# Patient Record
Sex: Female | Born: 1975
Health system: Southern US, Community
[De-identification: ages and names within clinical notes are randomized; demographics above are authoritative.]

## PROBLEM LIST (undated history)

## (undated) DIAGNOSIS — J45909 Unspecified asthma, uncomplicated: Secondary | ICD-10-CM

---

## 2008-03-02 HISTORY — PX: CYST EXCISION: SHX5701

## 2014-02-17 ENCOUNTER — Encounter (HOSPITAL_BASED_OUTPATIENT_CLINIC_OR_DEPARTMENT_OTHER): Payer: Self-pay | Admitting: *Deleted

## 2014-02-17 ENCOUNTER — Emergency Department (HOSPITAL_BASED_OUTPATIENT_CLINIC_OR_DEPARTMENT_OTHER)
Admission: EM | Admit: 2014-02-17 | Discharge: 2014-02-17 | Disposition: A | Discharge status: Home / Self Care | Payer: Managed Care, Other (non HMO) | Attending: Emergency Medicine | Admitting: Emergency Medicine

## 2014-02-17 DIAGNOSIS — J040 Acute laryngitis: Secondary | ICD-10-CM | POA: Diagnosis not present

## 2014-02-17 DIAGNOSIS — R0981 Nasal congestion: Secondary | ICD-10-CM | POA: Diagnosis present

## 2014-02-17 DIAGNOSIS — J069 Acute upper respiratory infection, unspecified: Secondary | ICD-10-CM | POA: Diagnosis not present

## 2014-02-17 MED ORDER — GUAIFENESIN-CODEINE 100-10 MG/5ML PO SYRP: 5.0000 mL | ORAL_SOLUTION | Freq: Three times a day (TID) | ORAL | Status: DC | PRN

## 2014-02-17 MED ORDER — PREDNISONE 10 MG PO TABS
60.0000 mg | ORAL_TABLET | Freq: Once | ORAL | Status: AC
  Administered 2014-02-17: 60 mg via ORAL
  Filled 2014-02-17 (×2): qty 1

## 2014-02-17 NOTE — ED Provider Notes (Signed)
CSN: [id number]     Arrival date & time 02/17/14  1655 History   First MD Initiated Contact with Patient 02/17/14 1711     Chief Complaint  Patient presents with  . URI     (Consider location/radiation/quality/duration/timing/severity/associated sxs/prior Treatment) HPI   38 year old female presents with URI symptoms. Patient reports for the past 3 days she has had nasal congestion, sneezing, nonproductive cough, having sore throat and bilateral ear pain. Yesterday she lost her voice. Reports subjective fever. Denies any severe headache, neck stiffness, shortness of breath, abdominal cramping, vomiting or diarrhea or rash. Patient is a nonsmoker. She has tried over-the-counter cough medication with minimal relief.  History reviewed. No pertinent past medical history. History reviewed. No pertinent past surgical history. History reviewed. No pertinent family history. History  Substance Use Topics  . Smoking status: Never Smoker   . Smokeless tobacco: Not on file  . Alcohol Use: No   OB History    No data available     Review of Systems  All other systems reviewed and are negative.     Allergies  Review of patient's allergies indicates no known allergies.  Home Medications   Prior to Admission medications   Not on File   BP 112/81 mmHg  Pulse 77  Temp(Src) 97.9 F (36.6 C) (Oral)  Resp 18  Ht 5\' 8"  (1.727 m)  Wt 168 lb (76.204 kg)  BMI 25.55 kg/m2  SpO2 100%  LMP 01/30/2014 Physical Exam  Constitutional: She appears well-developed and well-nourished. No distress.  HENT:  Head: Atraumatic.  Right Ear: External ear normal.  Left Ear: External ear normal.  Nose: Nose normal.  Mouth/Throat: Oropharynx is clear and moist. No oropharyngeal exudate.  Throat: Uvula is midline, no tonsillar enlargement or exudates, mild posterior oropharyngeal erythema without any deep tissue abscess, no trismus.  Eyes: Conjunctivae are normal.  Neck: Normal range of motion. Neck  supple.  No nuchal rigidity  Cardiovascular: Normal rate and regular rhythm.   Pulmonary/Chest: Effort normal and breath sounds normal. She has no wheezes.  Abdominal: Soft. There is no tenderness.  Lymphadenopathy:    She has no cervical adenopathy.  Neurological: She is alert.  Skin: No rash noted.  Psychiatric: She has a normal mood and affect.  Nursing note and vitals reviewed.   ED Course  Procedures (including critical care time)  Patient with URI symptoms and laryngitis. Course throat given here, patient will be treated symptomatically. antibiotic is not indicative at this time.  Labs Review Labs Reviewed - No data to display  Imaging Review No results found.   EKG Interpretation None      MDM   Final diagnoses:  Laryngitis  URI (upper respiratory infection)    BP 112/81 mmHg  Pulse 77  Temp(Src) 97.9 F (36.6 C) (Oral)  Resp 18  Ht 5\' 8"  (1.727 m)  Wt 168 lb (76.204 kg)  BMI 25.55 kg/m2  SpO2 100%  LMP 01/30/2014     Fayrene HelperBowie Maggie Senseney, PA-C 02/17/14 1825  Toy CookeyMegan Docherty, MD 02/18/14 0002

## 2014-02-17 NOTE — ED Notes (Signed)
Pt reports cough, congestion, sore throat and ear pain x 2 days.

## 2014-02-17 NOTE — Discharge Instructions (Signed)
Laryngitis At the top of your windpipe is your voice box. It is the source of your voice. Inside your voice box are 2 bands of muscles called vocal cords. When you breathe, your vocal cords are relaxed and open so that air can get into the lungs. When you decide to say something, these cords come together and vibrate. The sound from these vibrations goes into your throat and comes out through your mouth as sound. Laryngitis is an inflammation of the vocal cords that causes hoarseness, cough, loss of voice, sore throat, and dry throat. Laryngitis can be temporary (acute) or long-term (chronic). Most cases of acute laryngitis improve with time.Chronic laryngitis lasts for more than 3 weeks. CAUSES Laryngitis can often be related to excessive smoking, talking, or yelling, as well as inhalation of toxic fumes and allergies. Acute laryngitis is usually caused by a viral infection, vocal strain, measles or mumps, or bacterial infections. Chronic laryngitis is usually caused by vocal cord strain, vocal cord injury, postnasal drip, growths on the vocal cords, or acid reflux. SYMPTOMS   Cough.  Sore throat.  Dry throat. RISK FACTORS  Respiratory infections.  Exposure to irritating substances, such as cigarette smoke, excessive amounts of alcohol, stomach acids, and workplace chemicals.  Voice trauma, such as vocal cord injury from shouting or speaking too loud. DIAGNOSIS  Your cargiver will perform a physical exam. During the physical exam, your caregiver will examine your throat. The most common sign of laryngitis is hoarseness. Laryngoscopy may be necessary to confirm the diagnosis of this condition. This procedure allows your caregiver to look into the larynx. HOME CARE INSTRUCTIONS  Drink enough fluids to keep your urine clear or pale yellow.  Rest until you no longer have symptoms or as directed by your caregiver.  Breathe in moist air.  Take all medicine as directed by your  caregiver.  Do not smoke.  Talk as little as possible (this includes whispering).  Write on paper instead of talking until your voice is back to normal.  Follow up with your caregiver if your condition has not improved after 10 days. SEEK MEDICAL CARE IF:   You have trouble breathing.  You cough up blood.  You have persistent fever.  You have increasing pain.  You have difficulty swallowing. MAKE SURE YOU:  Understand these instructions.  Will watch your condition.  Will get help right away if you are not doing well or get worse. Document Released: 02/16/2005 Document Revised: 05/11/2011 Document Reviewed: 04/24/2010 [facility] Patient Information 2015 [place], [place]. This information is not intended to replace advice given to you by your health care provider. Make sure you discuss any questions you have with your health care provider.  Upper Respiratory Infection, Adult An upper respiratory infection (URI) is also known as the common cold. It is often caused by a type of germ (virus). Colds are easily spread (contagious). You can pass it to others by kissing, coughing, sneezing, or drinking out of the same glass. Usually, you get better in 1 or 2 weeks.  HOME CARE   Only take medicine as told by your doctor.  Use a warm mist humidifier or breathe in steam from a hot shower.  Drink enough water and fluids to keep your pee (urine) clear or pale yellow.  Get plenty of rest.  Return to work when your temperature is back to normal or as told by your doctor. You may use a face mask and wash your hands to stop your cold from spreading.  GET HELP RIGHT AWAY IF:   After the first few days, you feel you are getting worse.  You have questions about your medicine.  You have chills, shortness of breath, or brown or red spit (mucus).  You have yellow or brown snot (nasal discharge) or pain in the face, especially when you bend forward.  You have a fever, puffy (swollen) neck,  pain when you swallow, or white spots in the back of your throat.  You have a bad headache, ear pain, sinus pain, or chest pain.  You have a high-pitched whistling sound when you breathe in and out (wheezing).  You have a lasting cough or cough up blood.  You have sore muscles or a stiff neck. MAKE SURE YOU:   Understand these instructions.  Will watch your condition.  Will get help right away if you are not doing well or get worse. Document Released: 08/05/2007 Document Revised: 05/11/2011 Document Reviewed: 05/24/2013 [facility] Patient Information 2015 [place], [place]. This information is not intended to replace advice given to you by your health care provider. Make sure you discuss any questions you have with your health care provider.

## 2014-02-17 NOTE — ED Notes (Signed)
[facility]

## 2014-02-24 ENCOUNTER — Emergency Department (HOSPITAL_BASED_OUTPATIENT_CLINIC_OR_DEPARTMENT_OTHER)
Admission: EM | Admit: 2014-02-24 | Discharge: 2014-02-24 | Disposition: A | Discharge status: Other Acute Inpatient Hospital | Payer: Managed Care, Other (non HMO) | Attending: Emergency Medicine | Admitting: Emergency Medicine

## 2014-02-24 ENCOUNTER — Emergency Department (HOSPITAL_BASED_OUTPATIENT_CLINIC_OR_DEPARTMENT_OTHER): Payer: Managed Care, Other (non HMO)

## 2014-02-24 ENCOUNTER — Encounter (HOSPITAL_BASED_OUTPATIENT_CLINIC_OR_DEPARTMENT_OTHER): Payer: Self-pay | Admitting: *Deleted

## 2014-02-24 DIAGNOSIS — R0602 Shortness of breath: Secondary | ICD-10-CM

## 2014-02-24 DIAGNOSIS — E876 Hypokalemia: Secondary | ICD-10-CM | POA: Diagnosis not present

## 2014-02-24 DIAGNOSIS — J45902 Unspecified asthma with status asthmaticus: Secondary | ICD-10-CM | POA: Insufficient documentation

## 2014-02-24 DIAGNOSIS — Z79899 Other long term (current) drug therapy: Secondary | ICD-10-CM | POA: Insufficient documentation

## 2014-02-24 HISTORY — DX: Unspecified asthma, uncomplicated: J45.909

## 2014-02-24 LAB — TROPONIN I: Troponin I: <0.03 ng/mL (ref <0.031)

## 2014-02-24 LAB — BASIC METABOLIC PANEL
Anion gap: 10 (ref 5–15)
BUN: 8 mg/dL (ref 6–23)
CO2: 25 mmol/L (ref 19–32)
CREATININE: 0.77 mg/dL (ref 0.50–1.10)
Calcium: 8.8 mg/dL (ref 8.4–10.5)
Chloride: 105 mEq/L (ref 96–112)
Glucose, Bld: 111 mg/dL — ABNORMAL HIGH (ref 70–99)
Potassium: 3 mmol/L — ABNORMAL LOW (ref 3.5–5.1)
Sodium: 140 mmol/L (ref 135–145)

## 2014-02-24 LAB — CBC WITH DIFFERENTIAL/PLATELET
BASOS PCT: 0 % (ref 0–1)
Basophils Absolute: 0 10*3/uL (ref 0.0–0.1)
EOS ABS: 0 10*3/uL (ref 0.0–0.7)
Eosinophils Relative: 1 % (ref 0–5)
HEMATOCRIT: 38.4 % (ref 36.0–46.0)
HEMOGLOBIN: 12.9 g/dL (ref 12.0–15.0)
Lymphocytes Relative: 24 % (ref 12–46)
Lymphs Abs: 2 10*3/uL (ref 0.7–4.0)
MCH: 30.9 pg (ref 26.0–34.0)
MCHC: 33.6 g/dL (ref 30.0–36.0)
MCV: 91.9 fL (ref 78.0–100.0)
MONO ABS: 0.6 10*3/uL (ref 0.1–1.0)
Monocytes Relative: 7 % (ref 3–12)
NEUTROS PCT: 68 % (ref 43–77)
Neutro Abs: 5.9 10*3/uL (ref 1.7–7.7)
Platelets: 188 10*3/uL (ref 150–400)
RBC: 4.18 MIL/uL (ref 3.87–5.11)
RDW: 12.6 % (ref 11.5–15.5)
WBC: 8.6 10*3/uL (ref 4.0–10.5)

## 2014-02-24 LAB — BRAIN NATRIURETIC PEPTIDE: B Natriuretic Peptide: 75.4 pg/mL (ref 0.0–100.0)

## 2014-02-24 LAB — D-DIMER, QUANTITATIVE: D-Dimer, Quant: 0.27 ug/mL-FEU (ref 0.00–0.48)

## 2014-02-24 MED ORDER — IPRATROPIUM BROMIDE 0.02 % IN SOLN
0.5000 mg | Freq: Once | RESPIRATORY_TRACT | Status: AC
  Administered 2014-02-24: 0.5 mg via RESPIRATORY_TRACT
  Filled 2014-02-24: qty 2.5

## 2014-02-24 MED ORDER — PREDNISONE 10 MG PO TABS
ORAL_TABLET | ORAL | Status: AC
  Filled 2014-02-24: qty 1

## 2014-02-24 MED ORDER — PREDNISONE 10 MG PO TABS
60.0000 mg | ORAL_TABLET | Freq: Once | ORAL | Status: AC
  Administered 2014-02-24: 60 mg via ORAL
  Filled 2014-02-24 (×2): qty 1

## 2014-02-24 MED ORDER — ALBUTEROL SULFATE (2.5 MG/3ML) 0.083% IN NEBU
5.0000 mg | INHALATION_SOLUTION | Freq: Once | RESPIRATORY_TRACT | Status: AC
  Administered 2014-02-24: 5 mg via RESPIRATORY_TRACT
  Filled 2014-02-24: qty 6

## 2014-02-24 MED ORDER — IPRATROPIUM-ALBUTEROL 0.5-2.5 (3) MG/3ML IN SOLN
3.0000 mL | RESPIRATORY_TRACT | Status: DC
  Administered 2014-02-24: 3 mL via RESPIRATORY_TRACT
  Filled 2014-02-24: qty 3

## 2014-02-24 MED ORDER — ALBUTEROL (5 MG/ML) CONTINUOUS INHALATION SOLN
10.0000 mg/h | INHALATION_SOLUTION | Freq: Once | RESPIRATORY_TRACT | Status: AC
  Administered 2014-02-24: 10 mg/h via RESPIRATORY_TRACT
  Filled 2014-02-24: qty 20

## 2014-02-24 MED ORDER — POTASSIUM CHLORIDE CRYS ER 20 MEQ PO TBCR
40.0000 meq | EXTENDED_RELEASE_TABLET | Freq: Once | ORAL | Status: AC
  Administered 2014-02-24: 40 meq via ORAL
  Filled 2014-02-24: qty 2

## 2014-02-24 NOTE — ED Provider Notes (Signed)
The patient appears reasonably stabilized for transfer considering the current resources, flow, and capabilities available in the ED at this time, and I doubt any other [facility] further screening and/or treatment in the ED prior to transfer.   Joya Gaskinsonald W Lennix Rotundo, MD 02/24/14 [phone]

## 2014-02-24 NOTE — ED Provider Notes (Signed)
CSN: [id number]     Arrival date & time 02/24/14  0919 History   First MD Initiated Contact with Patient 02/24/14 [phone]     Chief Complaint  Patient presents with  . Shortness of Breath    Patient is a 38 y.o. female presenting with shortness of breath. The history is provided by the patient.  Shortness of Breath Severity:  Moderate Onset quality:  Gradual Timing:  Intermittent Progression:  Worsening Chronicity:  New Relieved by:  Rest Worsened by:  Activity Associated symptoms: cough, sore throat and wheezing   Associated symptoms: no fever and no vomiting   Pt reports recent viral illness - she reports recent cough/sore throat and sneezing This occurred last weekend Over the past 3 days she has had increased cough and SOB She feels like she is wheezing (h/o asthma) She is a nonsmoker No fever No hemoptysis She reports chest tenderness with cough only   No h/o CAD/PE  Past Medical History  Diagnosis Date  . Asthma    Past Surgical History  Procedure Laterality Date  . Cyst excision  2010    on cervix   No family history on file. History  Substance Use Topics  . Smoking status: Never Smoker   . Smokeless tobacco: Not on file  . Alcohol Use: Yes     Comment: occasionally (several a month)   OB History    No data available     Review of Systems  Constitutional: Negative for fever.  HENT: Positive for sore throat.   Respiratory: Positive for cough, shortness of breath and wheezing.   Cardiovascular: Negative for leg swelling.  Gastrointestinal: Negative for vomiting.  All other systems reviewed and are negative.     Allergies  Review of patient's allergies indicates no known allergies.  Home Medications   Prior to Admission medications   Medication Sig Start Date End Date Taking? Authorizing Provider  albuterol (PROVENTIL HFA;VENTOLIN HFA) 108 (90 BASE) MCG/ACT inhaler Inhale 2 puffs into the lungs every 6 (six) hours as needed for wheezing or  shortness of breath.   Yes Historical Provider, MD  guaiFENesin-codeine (ROBITUSSIN AC) 100-10 MG/5ML syrup Take 5 mLs by mouth 3 (three) times daily as needed for cough. 02/17/14   Fayrene HelperBowie Tran, PA-C   BP 123/73 mmHg  Pulse 82  Temp(Src) 98.6 F (37 C) (Oral)  Resp 18  Ht 5\' 8"  (1.727 m)  Wt 168 lb (76.204 kg)  BMI 25.55 kg/m2  SpO2 98%  LMP 02/22/2014 Physical Exam CONSTITUTIONAL: Well developed/well nourished HEAD: Normocephalic/atraumatic EYES: EOMI/PERRL ENMT: Mucous membranes moist, uvula midline, no erythema noted, no stridor noted NECK: supple no meningeal signs SPINE/BACK:entire spine nontender CV: S1/S2 noted, no murmurs/rubs/gallops noted LUNGS: decreased BS noted bilaterally, no apparent distress ABDOMEN: soft, nontender, no rebound or guarding, bowel sounds noted throughout abdomen GU:no cva tenderness NEURO: Pt is awake/alert/appropriate, moves all extremitiesx4.  No facial droop.   EXTREMITIES: pulses normal/equal, full ROM, no LE edema noted SKIN: warm, color normal PSYCH: no abnormalities of mood noted, alert and oriented to situation  ED Course  Procedures  CRITICAL CARE Performed by: Joya GaskinsWICKLINE,Yakima Kreitzer W Total critical care time: 33 Critical care time was exclusive of separately billable procedures and treating other patients. Critical care was necessary to treat or prevent imminent or life-threatening deterioration. Critical care was time spent personally by me on the following activities: development of treatment plan with patient and/or surrogate as well as nursing, discussions with consultants, evaluation of patient's response to treatment, examination  of patient, obtaining history from patient or surrogate, ordering and performing treatments and interventions, ordering and review of laboratory studies, ordering and review of radiographic studies, pulse oximetry and re-evaluation of patient's condition. PATIENT ADMTTED DUE TO PERSISTENT SOB AND HYPOXIA AND  AMBULATION AFTER MULTIPLE NEBULIZED TREATMENTS  10:39 AM Pt well appearing/no distress, but has decreased BS bilaterally Minimal response to initial treatment Will give more nebs/steroids 12:19 PM Pt reports continued SOB even with nebs and now reporting chest tightness Will explore for other causes of her symptoms (initial complaints were cough/sore throat/wheezing) 2:44 PM Labs unremarkable Despite treatment, pt still with tacycardia/tachypnea and hypoxia with ambulation. Will admit for possible status asthmaticus (pt still with decreased BS bilaterally) [facility]  Labs Review Labs Reviewed  BASIC METABOLIC PANEL - Abnormal; Notable for the following:    Potassium 3.0 (*)    Glucose, Bld 111 (*)    All other components within normal limits  CBC WITH DIFFERENTIAL  BRAIN NATRIURETIC PEPTIDE  TROPONIN I  D-DIMER, QUANTITATIVE    Imaging Review Dg Chest 2 View  02/24/2014   CLINICAL DATA:  Sore throat and recent upper respiratory infection/laryngitis with recent worsening of symptoms. Wheezing.  EXAM: CHEST  2 VIEW  COMPARISON:  09/27/2012  FINDINGS: Lungs are adequately inflated without consolidation or effusion. Cardiomediastinal silhouette, bones and soft tissues are normal.  IMPRESSION: No active cardiopulmonary disease.   Electronically Signed   By: Elberta Fortisaniel  Boyle M.D.   On: 02/24/2014 09:59    Medications  ipratropium-albuterol (DUONEB) 0.5-2.5 (3) MG/3ML nebulizer solution 3 mL (3 mLs Nebulization Given 02/24/14 1418)  albuterol (PROVENTIL) (2.5 MG/3ML) 0.083% nebulizer solution 5 mg (5 mg Nebulization Given 02/24/14 0945)  ipratropium (ATROVENT) nebulizer solution 0.5 mg (0.5 mg Nebulization Given 02/24/14 0945)  albuterol (PROVENTIL,VENTOLIN) solution continuous neb (10 mg/hr Nebulization Given 02/24/14 1045)  predniSONE (DELTASONE) tablet 60 mg (60 mg Oral Given 02/24/14 1038)  predniSONE (DELTASONE) 10 MG tablet (  Duplicate 02/24/14 1058)   potassium chloride SA (K-DUR,KLOR-CON) CR tablet 40 mEq (40 mEq Oral Given 02/24/14 1402)    EKG Interpretation  Date/Time:  Saturday February 24 2014 11:39:04 EST Ventricular Rate:  105 PR Interval:  126 QRS Duration: 88 QT Interval:  334 QTC Calculation: 441 R Axis:   -34 Text Interpretation:  Sinus tachycardia Possible Left atrial enlargement Left axis deviation ST \\T \ T wave abnormality, consider inferior ischemia Abnormal ECG No previous ECGs available Confirmed by Bebe ShaggyWICKLINE  MD, Dorinda HillNALD ([id number]) on 02/24/2014 12:19:29 PM        MDM   Final diagnoses:  Shortness of breath  Status asthmaticus, unspecified asthma severity  Hypokalemia    Nursing notes including past medical history and social history reviewed and considered in documentation xrays/imaging reviewed by myself and considered during evaluation Previous records reviewed and considered     Joya Gaskinsonald W Keoki Mchargue, MD 02/24/14 1446

## 2014-02-24 NOTE — ED Notes (Signed)
Ambulated pt on room air with pulse oximetry for a total of approx. 150 feet. Pulse consistently remained between 70s-90s. Approx. every 50 ft, pt would begin to feel shob as sat would drop (lowest sat was 85%). Pt would stop ambulating, take several deep breaths and sats would rise (within 30 secs) to 99-100% with pt no longer feeling shob. Dr. Bebe ShaggyWickline notified. Danna HeftyGolden, Joevon Holliman Lee, RN

## 2014-02-24 NOTE — ED Notes (Signed)
Ambulated pt approx. 175 ft on room air with pulse oximetry -- hr was between 100s-120s; approx every 50 ft., pt experienced shob with a decrease in sats (lowest 90%); pt would stop, take deep breaths and sat would return to 100% within 30 secs and pt's shob would resolve. Dr. Bebe ShaggyWickline notified. Danna HeftyGolden, Jenalee Trevizo Lee, RN

## 2014-02-24 NOTE — ED Notes (Signed)
Pt states she was here a week ago with a sore throat and dx with laryngitis and URI. Symptoms have worsened and pt states she's been wheezing and has needed her inhaler (hasn't needed today). Danna HeftyGolden, Itzabella Sorrels Lee, RN

## 2014-02-24 NOTE — ED Notes (Signed)
Patient taken off CAT at this time. Stated she feels just a bit better. RT will continue to monitor.

## 2014-02-24 NOTE — ED Notes (Signed)
Dr. Bebe ShaggyWickline notified that pt states she doesn't feel better post breathing treatments and lung sounds are unchanged. Danna HeftyGolden, Caysen Whang Lee, RN

## 2014-11-15 ENCOUNTER — Emergency Department (HOSPITAL_BASED_OUTPATIENT_CLINIC_OR_DEPARTMENT_OTHER)
Admission: EM | Admit: 2014-11-15 | Discharge: 2014-11-15 | Disposition: A | Discharge status: Home / Self Care | Payer: Managed Care, Other (non HMO) | Attending: Emergency Medicine | Admitting: Emergency Medicine

## 2014-11-15 ENCOUNTER — Encounter (HOSPITAL_BASED_OUTPATIENT_CLINIC_OR_DEPARTMENT_OTHER): Payer: Self-pay | Admitting: *Deleted

## 2014-11-15 DIAGNOSIS — R1031 Right lower quadrant pain: Secondary | ICD-10-CM | POA: Diagnosis present

## 2014-11-15 DIAGNOSIS — J45909 Unspecified asthma, uncomplicated: Secondary | ICD-10-CM | POA: Insufficient documentation

## 2014-11-15 DIAGNOSIS — R197 Diarrhea, unspecified: Secondary | ICD-10-CM

## 2014-11-15 DIAGNOSIS — R112 Nausea with vomiting, unspecified: Secondary | ICD-10-CM | POA: Insufficient documentation

## 2014-11-15 DIAGNOSIS — Z79899 Other long term (current) drug therapy: Secondary | ICD-10-CM | POA: Diagnosis not present

## 2014-11-15 DIAGNOSIS — Z3202 Encounter for pregnancy test, result negative: Secondary | ICD-10-CM | POA: Diagnosis not present

## 2014-11-15 DIAGNOSIS — R509 Fever, unspecified: Secondary | ICD-10-CM | POA: Diagnosis not present

## 2014-11-15 LAB — CBC WITH DIFFERENTIAL/PLATELET
Basophils Absolute: 0.1 10*3/uL (ref 0.0–0.1)
Basophils Relative: 1 %
Eosinophils Absolute: 0 10*3/uL (ref 0.0–0.7)
Eosinophils Relative: 0 %
HCT: 40.8 % (ref 36.0–46.0)
HEMOGLOBIN: 13.8 g/dL (ref 12.0–15.0)
LYMPHS ABS: 1 10*3/uL (ref 0.7–4.0)
LYMPHS PCT: 15 %
MCH: 30.5 pg (ref 26.0–34.0)
MCHC: 33.8 g/dL (ref 30.0–36.0)
MCV: 90.1 fL (ref 78.0–100.0)
Monocytes Absolute: 0.6 10*3/uL (ref 0.1–1.0)
Monocytes Relative: 9 %
NEUTROS PCT: 75 %
Neutro Abs: 5 10*3/uL (ref 1.7–7.7)
Platelets: 183 10*3/uL (ref 150–400)
RBC: 4.53 MIL/uL (ref 3.87–5.11)
RDW: 12.4 % (ref 11.5–15.5)
WBC: 6.6 10*3/uL (ref 4.0–10.5)

## 2014-11-15 LAB — URINALYSIS, ROUTINE W REFLEX MICROSCOPIC
GLUCOSE, UA: NEGATIVE mg/dL
Hgb urine dipstick: NEGATIVE
Ketones, ur: 15 mg/dL — AB
LEUKOCYTES UA: NEGATIVE
NITRITE: NEGATIVE
Protein, ur: NEGATIVE mg/dL
Specific Gravity, Urine: 1.025 (ref 1.005–1.030)
Urobilinogen, UA: 1 mg/dL (ref 0.0–1.0)
pH: 6 (ref 5.0–8.0)

## 2014-11-15 LAB — BASIC METABOLIC PANEL
Anion gap: 7 (ref 5–15)
BUN: 9 mg/dL (ref 6–20)
CHLORIDE: 104 mmol/L (ref 101–111)
CO2: 24 mmol/L (ref 22–32)
Calcium: 8.8 mg/dL — ABNORMAL LOW (ref 8.9–10.3)
Creatinine, Ser: 0.82 mg/dL (ref 0.44–1.00)
GFR calc Af Amer: >60 mL/min (ref >60)
GFR calc non Af Amer: >60 mL/min (ref >60)
GLUCOSE: 94 mg/dL (ref 65–99)
Potassium: 3.2 mmol/L — ABNORMAL LOW (ref 3.5–5.1)
SODIUM: 135 mmol/L (ref 135–145)

## 2014-11-15 LAB — PREGNANCY, URINE: Preg Test, Ur: NEGATIVE

## 2014-11-15 MED ORDER — ONDANSETRON 4 MG PO TBDP: ORAL_TABLET | ORAL | Status: AC

## 2014-11-15 MED ORDER — ONDANSETRON HCL 4 MG/2ML IJ SOLN
4.0000 mg | Freq: Once | INTRAMUSCULAR | Status: AC
  Administered 2014-11-15: 4 mg via INTRAVENOUS
  Filled 2014-11-15: qty 2

## 2014-11-15 MED ORDER — SODIUM CHLORIDE 0.9 % IV BOLUS (SEPSIS)
1000.0000 mL | Freq: Once | INTRAVENOUS | Status: AC
  Administered 2014-11-15: 1000 mL via INTRAVENOUS

## 2014-11-15 NOTE — ED Provider Notes (Signed)
CSN: [id number]     Arrival date & time 11/15/14  4098 History   First MD Initiated Contact with Patient 11/15/14 [phone]     Chief Complaint  Patient presents with  . Abdominal Pain     (Consider location/radiation/quality/duration/timing/severity/associated sxs/prior Treatment) Patient is a 39 y.o. female presenting with abdominal pain.  Abdominal Pain Associated symptoms: chills, diarrhea, fever, nausea and vomiting    Patient with approximately 4-5 days of diarrhea associated with some abdominal pain. Worsening right lower quadrant. Also had 2 days of nausea and vomiting however not today. She has had fevers and chills but no measured temperatures. Has multiple sick contacts with what sounds like viral gastroenteritis. She's had these symptoms before were never for this period of time. Diarrhea is just watery no blood or other abnormalities. No recent travels.  Past Medical History  Diagnosis Date  . Asthma    Past Surgical History  Procedure Laterality Date  . Cyst excision  2010    on cervix   History reviewed. No pertinent family history. Social History  Substance Use Topics  . Smoking status: Never Smoker   . Smokeless tobacco: None  . Alcohol Use: Yes     Comment: occasionally (several a month)   OB History    No data available     Review of Systems  Constitutional: Positive for fever and chills.  Gastrointestinal: Positive for nausea, vomiting, abdominal pain and diarrhea.  All other systems reviewed and are negative.     Allergies  Review of patient's allergies indicates no known allergies.  Home Medications   Prior to Admission medications   Medication Sig Start Date End Date Taking? Authorizing Provider  albuterol (PROVENTIL HFA;VENTOLIN HFA) 108 (90 BASE) MCG/ACT inhaler Inhale 2 puffs into the lungs every 6 (six) hours as needed for wheezing or shortness of breath.    Historical Provider, MD  ondansetron (ZOFRAN ODT) 4 MG disintegrating tablet  ODT  q4 hours prn nausea/vomit 11/15/14   Marily Memos, MD   BP 113/72 mmHg  Pulse 69  Temp(Src) 98.4 F (36.9 C) (Oral)  Resp 14  Ht 5' 7.5" (1.715 m)  Wt 166 lb (75.297 kg)  BMI 25.60 kg/m2  SpO2 100%  LMP 10/27/2014 Physical Exam  Constitutional: She is oriented to person, place, and time. She appears well-developed and well-nourished.  HENT:  Head: Normocephalic and atraumatic.  Eyes: Conjunctivae and EOM are normal. Right eye exhibits no discharge. Left eye exhibits no discharge.  Cardiovascular: Normal rate and regular rhythm.   Pulmonary/Chest: Effort normal and breath sounds normal. No respiratory distress.  Abdominal: Soft. She exhibits no distension. There is no tenderness. There is no rebound.  Musculoskeletal: Normal range of motion. She exhibits no edema or tenderness.  Neurological: She is alert and oriented to person, place, and time.  Skin: Skin is warm and dry.  Nursing note and vitals reviewed.   ED Course  Procedures (including critical care time) Labs Review Labs Reviewed  BASIC METABOLIC PANEL - Abnormal; Notable for the following:    Potassium 3.2 (*)    Calcium 8.8 (*)    All other components within normal limits  URINALYSIS, ROUTINE W REFLEX MICROSCOPIC (NOT AT [facility]) - Abnormal; Notable for the following:    Color, Urine AMBER (*)    APPearance CLOUDY (*)    Bilirubin Urine SMALL (*)    Ketones, ur 15 (*)    All other components within normal limits  CBC WITH DIFFERENTIAL/PLATELET  PREGNANCY, URINE  Imaging Review No results found. I have personally reviewed and evaluated these images and lab results as part of my medical decision-making.   EKG Interpretation None      MDM   Final diagnoses:  Diarrhea   Likely viral gastroenteritis. We'll evaluate with labs to make sure she does not slightly dehydrated or have a urinary tract infection. We'll treat symptomatically for now. Consider appendicitis in the differential diagnoses however with  minimal right lower quadrant tenderness, no fever, no white count, I feel this is low probability at this time and discussed with the patient that she can come here for reevaluation of her symptoms worsen. Patient is able to return if her symptoms get worse or don't improve.  I have personally and contemperaneously reviewed labs and imaging and used in my decision making as above.   A medical screening exam was performed and I feel the patient has had an appropriate workup for their chief complaint at this time and likelihood of emergent condition existing is low. They have been counseled on decision, discharge, follow up and which symptoms necessitate immediate return to the emergency department. They or their family verbally stated understanding and agreement with plan and discharged in stable condition.      Marily Memos, MD 11/15/14 1515

## 2014-11-15 NOTE — ED Notes (Signed)
Pt amb to room 1 with quick steady gait in nad. Pt reports diarrhea x Sunday, since then has had emesis x 3 total, none today, also body aches, reports subjective temp on Sunday, none since then. Pt has not taken any otc meds, states "I thought it would work itself out.Marland KitchenMarland Kitchen"

## 2016-01-16 ENCOUNTER — Emergency Department (HOSPITAL_BASED_OUTPATIENT_CLINIC_OR_DEPARTMENT_OTHER): Payer: Managed Care, Other (non HMO)

## 2016-01-16 ENCOUNTER — Emergency Department (HOSPITAL_BASED_OUTPATIENT_CLINIC_OR_DEPARTMENT_OTHER)
Admission: EM | Admit: 2016-01-16 | Discharge: 2016-01-16 | Disposition: A | Discharge status: Home / Self Care | Payer: Managed Care, Other (non HMO) | Attending: Emergency Medicine | Admitting: Emergency Medicine

## 2016-01-16 ENCOUNTER — Encounter (HOSPITAL_BASED_OUTPATIENT_CLINIC_OR_DEPARTMENT_OTHER): Payer: Self-pay | Admitting: *Deleted

## 2016-01-16 DIAGNOSIS — J45909 Unspecified asthma, uncomplicated: Secondary | ICD-10-CM | POA: Insufficient documentation

## 2016-01-16 DIAGNOSIS — R0602 Shortness of breath: Secondary | ICD-10-CM | POA: Insufficient documentation

## 2016-01-16 LAB — PREGNANCY, URINE: Preg Test, Ur: NEGATIVE

## 2016-01-16 MED ORDER — DEXAMETHASONE 6 MG PO TABS
ORAL_TABLET | ORAL | Status: AC
  Filled 2016-01-16: qty 1

## 2016-01-16 MED ORDER — IPRATROPIUM-ALBUTEROL 0.5-2.5 (3) MG/3ML IN SOLN
3.0000 mL | Freq: Four times a day (QID) | RESPIRATORY_TRACT | Status: DC
  Administered 2016-01-16: 3 mL via RESPIRATORY_TRACT
  Filled 2016-01-16: qty 3

## 2016-01-16 MED ORDER — DEXAMETHASONE 4 MG PO TABS
16.0000 mg | ORAL_TABLET | Freq: Once | ORAL | Status: AC
  Administered 2016-01-16: 16 mg via ORAL
  Filled 2016-01-16: qty 1

## 2016-01-16 NOTE — ED Provider Notes (Signed)
[facility] Provider Note   CSN: [id number] Arrival date & time: 01/16/16  1219     History   Chief Complaint Chief Complaint  Patient presents with  . Shortness of Breath    HPI Janice Harris is a 40 y.o. female.  HPI 40 year old female who presents with shortness of breath. She has a history of asthma exacerbation. States that overall symptoms very well controlled, last time ED steroids when she was admitted to [facility] for asthma exacerbation 1-2 years ago. He states that since then she has been very vigilant about her breathing, and yesterday while at work had some shortness of breath for which she took her inhaler for. When she got home she took a nebulizer treatment, and went to bed. This morning still felt a little short of breath so came to the ED for evaluation. States that this is definitely not as severe as what she normally feels with her asthma exacerbations, but states that she wanted to come sooner indicates it got worse throughout the week. No chest pain, cough, fever, URI symptoms, lower extremity edema or pain.   Past Medical History:  Diagnosis Date  . Asthma     There are no active problems to display for this patient.   Past Surgical History:  Procedure Laterality Date  . CYST EXCISION  2010   on cervix    OB History    No data available       Home Medications    Prior to Admission medications   Medication Sig Start Date End Date Taking? Authorizing Provider  albuterol (PROVENTIL HFA;VENTOLIN HFA) 108 (90 BASE) MCG/ACT inhaler Inhale 2 puffs into the lungs every 6 (six) hours as needed for wheezing or shortness of breath.    Historical Provider, MD  ondansetron (ZOFRAN ODT) 4 MG disintegrating tablet 4mg  ODT q4 hours prn nausea/vomit 11/15/14   Marily MemosJason Mesner, MD    Family History No family history on file.  Social History Social History  Substance Use Topics  . Smoking status: Never Smoker  . Smokeless tobacco: Never  Used  . Alcohol use Yes     Comment: occasionally (several a month)     Allergies   Patient has no known allergies.   Review of Systems Review of Systems 10/14 systems reviewed and are negative other than those stated in the HPI   Physical Exam Updated Vital Signs BP 118/79   Pulse 80   Temp 98.1 F (36.7 C) (Oral)   Resp 18   Ht 5\' 7"  (1.702 m)   Wt 173 lb (78.5 kg)   LMP 12/31/2015   SpO2 100%   BMI 27.10 kg/m   Physical Exam Physical Exam  Nursing note and vitals reviewed. Constitutional: Well developed, well nourished, non-toxic, and in no acute distress Head: Normocephalic and atraumatic.  Mouth/Throat: Oropharynx is clear and moist.  Neck: Normal range of motion. Neck supple.  Cardiovascular: Normal rate and regular rhythm.   Pulmonary/Chest: Effort normal and breath sounds normal.  Abdominal: Soft. There is no tenderness. There is no rebound and no guarding.  Musculoskeletal: Normal range of motion.  Neurological: Alert, no facial droop, fluent speech, moves all extremities symmetrically Skin: Skin is warm and dry.  Psychiatric: Cooperative   ED Treatments / Results  Labs (all labs ordered are listed, but only abnormal results are displayed) Labs Reviewed  PREGNANCY, URINE    EKG  EKG Interpretation  Date/Time:  Thursday January 16 2016 13:25:58 EST Ventricular Rate:  61 PR Interval:    QRS Duration: 88 QT Interval:  385 QTC Calculation: 388 R Axis:   56 Text Interpretation:  Sinus rhythm Confirmed by Askari Kinley MD, Mitzie Marlar [phone]) on 01/16/2016 2:03:58 PM       Radiology Dg Chest 2 View  Result Date: 01/16/2016 CLINICAL DATA:  Several days of shortness of breath associated with chest tightness. Nonsmoker. EXAM: CHEST  2 VIEW COMPARISON:  PA and lateral chest x-ray of February 24, 2014 FINDINGS: The lungs are well-expanded. There is no focal infiltrate. There is no pleural effusion. The heart and pulmonary vascularity are normal. The mediastinum is  normal in width. The bony thorax is unremarkable. IMPRESSION: There is no active cardiopulmonary disease. Electronically Signed   By: David  SwazilandJordan M.D.   On: 01/16/2016 14:00    Procedures Procedures (including critical care time)  Medications Ordered in ED Medications  ipratropium-albuterol (DUONEB) 0.5-2.5 (3) MG/3ML nebulizer solution 3 mL (3 mLs Nebulization Given 01/16/16 1334)  dexamethasone (DECADRON) 6 MG tablet (  Not Given 01/16/16 1333)  dexamethasone (DECADRON) tablet 16 mg (16 mg Oral Given 01/16/16 1330)     Initial Impression / Assessment and Plan / ED Course  I have reviewed the triage vital signs and the nursing notes.  Pertinent labs & imaging results that were available during my care of the patient were reviewed by me and considered in my medical decision making (see chart for details).  Clinical Course     40 year old female who presents with shortness of breath. She is well-appearing in no acute distress with normal vital signs, normal work of breathing, and overall clear lungs. Was ambulating here in the emergency department without any increased work of breathing or complaints of shortness of breath. Chest x-ray is visualized and w/o pneumonia, edema, or other acute cardiopulmonary processes. Did receive a dose of Decadron and breathing treatment. PERC negative and no concerns for PE. She appears well, with normal work of breathing. I do not feel that she necessarily requires any additional treatment or imaging at this time. To continue supportive care instructions for home. Strict return and follow-up instructions reviewed. She expressed understanding of all discharge instructions and felt comfortable with the plan of care.   Final Clinical Impressions(s) / ED Diagnoses   Final diagnoses:  SOB (shortness of breath)    New Prescriptions New Prescriptions   No medications on file     Lavera Guiseana Duo Axcel Horsch, MD 01/16/16 1428

## 2016-01-16 NOTE — Discharge Instructions (Signed)
Your chest x-ray looks good and your breathing and lung sounds are reassuring.  Continue to use your inhaler as needed.   Return without fail for worsening symptoms, including fever, difficulty breathing, chest pain, passing out or any other symptoms concerning to you.

## 2016-01-16 NOTE — ED Triage Notes (Signed)
Hx of asthma. Yesterday at work she had a episode of SOB unrelieved by her inhaler. She used her nebulizer at home with no change. She is speaking in complete sentences. Drove herself here.

## 2016-07-17 ENCOUNTER — Emergency Department (HOSPITAL_BASED_OUTPATIENT_CLINIC_OR_DEPARTMENT_OTHER): Payer: Managed Care, Other (non HMO)

## 2016-07-17 ENCOUNTER — Encounter (HOSPITAL_BASED_OUTPATIENT_CLINIC_OR_DEPARTMENT_OTHER): Payer: Self-pay | Admitting: Emergency Medicine

## 2016-07-17 ENCOUNTER — Emergency Department (HOSPITAL_BASED_OUTPATIENT_CLINIC_OR_DEPARTMENT_OTHER)
Admission: EM | Admit: 2016-07-17 | Discharge: 2016-07-17 | Disposition: A | Discharge status: Home / Self Care | Payer: Managed Care, Other (non HMO) | Attending: Emergency Medicine | Admitting: Emergency Medicine

## 2016-07-17 DIAGNOSIS — R0981 Nasal congestion: Secondary | ICD-10-CM | POA: Diagnosis not present

## 2016-07-17 DIAGNOSIS — J029 Acute pharyngitis, unspecified: Secondary | ICD-10-CM | POA: Insufficient documentation

## 2016-07-17 DIAGNOSIS — J45909 Unspecified asthma, uncomplicated: Secondary | ICD-10-CM | POA: Diagnosis not present

## 2016-07-17 DIAGNOSIS — R05 Cough: Secondary | ICD-10-CM | POA: Diagnosis present

## 2016-07-17 DIAGNOSIS — J069 Acute upper respiratory infection, unspecified: Secondary | ICD-10-CM | POA: Diagnosis not present

## 2016-07-17 LAB — RAPID STREP SCREEN (MED CTR MEBANE ONLY): Streptococcus, Group A Screen (Direct): NEGATIVE

## 2016-07-17 MED ORDER — LORATADINE-PSEUDOEPHEDRINE ER 5-120 MG PO TB12: 1.0000 | ORAL_TABLET | Freq: Two times a day (BID) | ORAL | 0 refills | Status: AC

## 2016-07-17 MED ORDER — IPRATROPIUM-ALBUTEROL 0.5-2.5 (3) MG/3ML IN SOLN
3.0000 mL | Freq: Four times a day (QID) | RESPIRATORY_TRACT | Status: DC
  Administered 2016-07-17: 3 mL via RESPIRATORY_TRACT
  Filled 2016-07-17: qty 3

## 2016-07-17 MED FILL — ALAVERT D-12 ALLERGY-SINUS: 5-120 | 7 days supply | Qty: 14 | Fill #0

## 2016-07-17 NOTE — ED Triage Notes (Signed)
Cough, sore throat, ear fullness and headache x 3 days. No relief with OTC meds. Hx of asthma. Using inhalers without relief.

## 2016-07-17 NOTE — ED Provider Notes (Signed)
[facility] Note   CSN: [id number] Arrival date & time: 07/17/16  0803     History   Chief Complaint Chief Complaint  Patient presents with  . Cough  . Sore Throat    HPI Janice Harris is a 41 y.o. female.  Patient with four-day history of cough sore throat and ear fullness sinus pressure and sinus pain. Patient has a history of asthma. Using her inhaler without any relief. Patient is felt as if she has been wheezing. No nausea vomiting no rash.      Past Medical History:  Diagnosis Date  . Asthma     There are no active problems to display for this patient.   Past Surgical History:  Procedure Laterality Date  . CYST EXCISION  2010   on cervix    OB History    No data available       Home Medications    Prior to Admission medications   Medication Sig Start Date End Date Taking? Authorizing Provider  albuterol (PROVENTIL HFA;VENTOLIN HFA) 108 (90 BASE) MCG/ACT inhaler Inhale 2 puffs into the lungs every 6 (six) hours as needed for wheezing or shortness of breath.    [provider]  loratadine-pseudoephedrine (CLARITIN-D 12 HOUR) 5-120 MG tablet Take 1 tablet by mouth 2 (two) times daily. 07/17/16   Vanetta MuldersZackowski, Kameka Whan, MD  ondansetron (ZOFRAN ODT) 4 MG disintegrating tablet 4mg  ODT q4 hours prn nausea/vomit 11/15/14   Mesner, Barbara CowerJason, MD    Family History No family history on file.  Social History Social History  Substance Use Topics  . Smoking status: Never Smoker  . Smokeless tobacco: Never Used  . Alcohol use Yes     Comment: occasionally (several a month)     Allergies   Patient has no known allergies.   Review of Systems Review of Systems  Constitutional: Negative for fever.  HENT: Positive for congestion and sinus pressure.   Eyes: Negative for redness.  Respiratory: Positive for cough, shortness of breath and wheezing.   Cardiovascular: Negative for chest pain.  Gastrointestinal: Negative for abdominal pain,  nausea and vomiting.  Genitourinary: Negative for dysuria.  Musculoskeletal: Negative for back pain.  Skin: Negative for rash.  Neurological: Positive for headaches. Negative for syncope, weakness and numbness.  Hematological: Does not bruise/bleed easily.  Psychiatric/Behavioral: Negative for confusion.     Physical Exam Updated Vital Signs BP 111/84 (BP Location: Right Arm)   Pulse 80   Temp 98.9 F (37.2 C) (Oral)   Resp 18   Ht 5\' 7"  (1.702 m)   Wt 175 lb (79.4 kg)   LMP 07/09/2016   SpO2 100%   BMI 27.41 kg/m   Physical Exam  Constitutional: She is oriented to person, place, and time. She appears well-developed and well-nourished. No distress.  HENT:  Head: Normocephalic and atraumatic.  Right Ear: External ear normal.  Left Ear: External ear normal.  Mouth/Throat: Oropharynx is clear and moist.  Eyes: Conjunctivae and EOM are normal. Pupils are equal, round, and reactive to light.  Neck: Normal range of motion. Neck supple.  Cardiovascular: Normal rate, regular rhythm and normal heart sounds.   Pulmonary/Chest: Effort normal and breath sounds normal. No respiratory distress. She has no wheezes.  Abdominal: Soft. Bowel sounds are normal. There is no tenderness.  Musculoskeletal: Normal range of motion.  Neurological: She is alert and oriented to person, place, and time. No cranial nerve deficit or sensory deficit. She exhibits normal muscle tone. Coordination normal.  Skin: Skin is warm and dry.  Nursing note and vitals reviewed.    ED Treatments / Results  Labs (all labs ordered are listed, but only abnormal results are displayed) Labs Reviewed  RAPID STREP SCREEN (NOT AT [facility])  CULTURE, GROUP A STREP [facility])    EKG  EKG Interpretation None       Radiology Dg Chest 2 View  Result Date: 07/17/2016 CLINICAL DATA:  Fever and cough EXAM: CHEST  2 VIEW COMPARISON:  01/16/2016 FINDINGS: Normal heart size and mediastinal contours. No acute infiltrate or  edema. No effusion or pneumothorax. No acute osseous findings. IMPRESSION: Negative chest. Electronically Signed   By: Marnee Spring M.D.   On: 07/17/2016 08:56    Procedures Procedures (including critical care time)  Medications Ordered in ED Medications  ipratropium-albuterol (DUONEB) 0.5-2.5 (3) MG/3ML nebulizer solution 3 mL (3 mLs Nebulization Given 07/17/16 0847)     Initial Impression / Assessment and Plan / ED Course  I have reviewed the triage vital signs and the nursing notes.  Pertinent labs & imaging results that were available during my care of the patient were reviewed by me and considered in my medical decision making (see chart for details).     Workup negative for pneumonia negative for strep throat. Symptoms's seem to be related to sinus congestion may be upper respiratory infection may be allergies. Will try a course of Claritin-D. Patient nontoxic no acute distress.  The patient felt as if she was wheezing no audible wheezing heard. No change after breathing treatment. Patient does have albuterol inhaler available at home.  Final Clinical Impressions(s) / ED Diagnoses   Final diagnoses:  Sore throat  Viral upper respiratory tract infection  Sinus congestion    New Prescriptions New Prescriptions   LORATADINE-PSEUDOEPHEDRINE (CLARITIN-D 12 HOUR) 5-120 MG TABLET    Take 1 tablet by mouth 2 (two) times daily.     Vanetta Mulders, MD 07/17/16 [phone]

## 2016-07-17 NOTE — Discharge Instructions (Signed)
Chest x-ray negative for pneumonia. Strep test negative for strep pharyngitis. Symptoms could be allergy or upper respiratory infection related. Continue to use your albuterol inhaler. And take the Claritin-D as directed. Follow up with your doctors. Return for any new or worse symptoms. Work note provided.

## 2016-07-19 LAB — CULTURE, GROUP A STREP (THRC)

## 2016-10-04 ENCOUNTER — Emergency Department (HOSPITAL_BASED_OUTPATIENT_CLINIC_OR_DEPARTMENT_OTHER)
Admission: EM | Admit: 2016-10-04 | Discharge: 2016-10-05 | Disposition: A | Discharge status: Home / Self Care | Payer: Managed Care, Other (non HMO) | Attending: Emergency Medicine | Admitting: Emergency Medicine

## 2016-10-04 ENCOUNTER — Encounter (HOSPITAL_BASED_OUTPATIENT_CLINIC_OR_DEPARTMENT_OTHER): Payer: Self-pay

## 2016-10-04 DIAGNOSIS — M791 Myalgia: Secondary | ICD-10-CM | POA: Diagnosis not present

## 2016-10-04 DIAGNOSIS — J0100 Acute maxillary sinusitis, unspecified: Secondary | ICD-10-CM

## 2016-10-04 DIAGNOSIS — J111 Influenza due to unidentified influenza virus with other respiratory manifestations: Secondary | ICD-10-CM

## 2016-10-04 DIAGNOSIS — J45909 Unspecified asthma, uncomplicated: Secondary | ICD-10-CM | POA: Insufficient documentation

## 2016-10-04 DIAGNOSIS — R509 Fever, unspecified: Secondary | ICD-10-CM | POA: Diagnosis not present

## 2016-10-04 DIAGNOSIS — R69 Illness, unspecified: Secondary | ICD-10-CM

## 2016-10-04 DIAGNOSIS — R110 Nausea: Secondary | ICD-10-CM | POA: Diagnosis present

## 2016-10-04 MED ORDER — AMOXICILLIN 500 MG PO CAPS: 1000.0000 mg | ORAL_CAPSULE | Freq: Two times a day (BID) | ORAL | 0 refills | Status: AC

## 2016-10-04 MED ORDER — AMOXICILLIN 500 MG PO CAPS
1000.0000 mg | ORAL_CAPSULE | Freq: Once | ORAL | Status: AC
  Administered 2016-10-05: 1000 mg via ORAL
  Filled 2016-10-04: qty 2

## 2016-10-04 NOTE — ED Triage Notes (Signed)
Got back from [place] on Monday and now having flu like symptoms with headache, body aches, nausea, congestion

## 2016-10-04 NOTE — ED Provider Notes (Signed)
[facility] Note   CSN: [id number] Arrival date & time: 10/04/16  1951  By signing my name below, I, Linna DarnerRussell Turner, attest that this documentation has been prepared under the direction and in the presence of physician practitioner, Dione BoozeGlick, Aylan Bayona, MD. Electronically Signed: Linna Darnerussell Turner, Scribe. 10/04/2016. 11:58 PM.  History   Chief Complaint Chief Complaint  Patient presents with  . Generalized Body Aches   The history is provided by the patient. No language interpreter was used.    HPI Comments: Janice Harris is a 41 y.o. female who presents to the Emergency Department complaining of persistent generalized body aches beginning three days ago. She reports associated nasal congestion, rhinorrhea with yellow-tinted mucus, sinus pain, subjective fevers/chills, nausea without vomiting, and a reduced appetite. There are no alleviating factors noted. Patient returned from a trip to [place], [place] six days ago. Two friends that went to [place] with her were recently diagnosed with influenza. Patient denies cough, vomiting, diarrhea, sore throat, or any other associated symptoms. She [place] care in [place] and intends to follow up should her symptoms persist.  Past Medical History:  Diagnosis Date  . Asthma     There are no active problems to display for this patient.   Past Surgical History:  Procedure Laterality Date  . CYST EXCISION  2010   on cervix    OB History    No data available       Home Medications    Prior to Admission medications   Medication Sig Start Date End Date Taking? Authorizing Provider  albuterol (PROVENTIL HFA;VENTOLIN HFA) 108 (90 BASE) MCG/ACT inhaler Inhale 2 puffs into the lungs every 6 (six) hours as needed for wheezing or shortness of breath.    [provider]  loratadine-pseudoephedrine (CLARITIN-D 12 HOUR) 5-120 MG tablet Take 1 tablet by mouth 2 (two) times daily. 07/17/16   Vanetta MuldersZackowski, Scott, MD    ondansetron (ZOFRAN ODT) 4 MG disintegrating tablet 4mg  ODT q4 hours prn nausea/vomit 11/15/14   Mesner, Barbara CowerJason, MD    Family History No family history on file.  Social History Social History  Substance Use Topics  . Smoking status: Never Smoker  . Smokeless tobacco: Never Used  . Alcohol use Yes     Comment: occasionally (several a month)     Allergies   Patient has no known allergies.   Review of Systems Review of Systems  All other systems reviewed and are negative.  Physical Exam Updated Vital Signs BP (!) 120/98 (BP Location: Left Arm)   Pulse 88   Temp 99.5 F (37.5 C) (Oral)   Resp 18   Ht 5\' 7"  (1.702 m)   Wt 173 lb (78.5 kg)   LMP 09/22/2016   SpO2 100%   BMI 27.10 kg/m   Physical Exam  Constitutional: She is oriented to person, place, and time. She appears well-developed and well-nourished.  HENT:  Head: Normocephalic and atraumatic.  Moderate tenderness to the bilateral maxillary sinuses.  Eyes: Pupils are equal, round, and reactive to light. EOM are normal.  Neck: Normal range of motion. Neck supple. No JVD present.  Cardiovascular: Normal rate, regular rhythm and normal heart sounds.   No murmur heard. Pulmonary/Chest: Effort normal and breath sounds normal. She has no wheezes. She has no rales. She exhibits no tenderness.  Abdominal: Soft. Bowel sounds are normal. She exhibits no distension and no mass. There is no tenderness.  Musculoskeletal: Normal range of motion. She exhibits no edema.  Lymphadenopathy:    She has no cervical adenopathy.  Neurological: She is alert and oriented to person, place, and time. No cranial nerve deficit. She exhibits normal muscle tone. Coordination normal.  Skin: Skin is warm and dry. No rash noted.  Psychiatric: She has a normal mood and affect. Her behavior is normal. Judgment and thought content normal.  Nursing note and vitals reviewed.  ED Treatments / Results   Procedures Procedures (including critical  care time)  DIAGNOSTIC STUDIES: Oxygen Saturation is 100% on RA, normal by my interpretation.    COORDINATION OF CARE: 11:58 PM Discussed treatment plan with pt at bedside and pt agreed to plan.  Medications Ordered in ED Medications  amoxicillin (AMOXIL) capsule 1,000 mg (not administered)     Initial Impression / Assessment and Plan / ED Course  I have reviewed the triage vital signs and the nursing notes.  Influenza-like illness with apparent secondary sinusitis. She has been ill for too long to consider antiviral treatment for influenza. She is discharged with prescription for amoxicillin, follow-up with her PCP. Given work release for 48 hours.  Final Clinical Impressions(s) / ED Diagnoses   Final diagnoses:  Influenza-like illness  Acute non-recurrent maxillary sinusitis    New Prescriptions New Prescriptions   AMOXICILLIN (AMOXIL) 500 MG CAPSULE    Take 2 capsules (1,000 mg total) by mouth 2 (two) times daily.   I personally performed the services described in this documentation, which was scribed in my presence. The recorded information has been reviewed and is accurate.     Dione BoozeGlick, Trey Gulbranson, MD 10/05/16 [id number]

## 2016-10-04 NOTE — ED Notes (Signed)
EDP at bedside  

## 2016-10-05 NOTE — Discharge Instructions (Signed)
Take ibuprofen, naproxen, or acetaminophen as needed for fever or aching. Drink plenty of fluids.  Sinusitis sometimes needs a longer course of antibiotics. If you are not feeling better after ten days on this antibiotic, see your doctor for an additional antibiotic prescription.

## 2016-11-10 ENCOUNTER — Emergency Department (HOSPITAL_BASED_OUTPATIENT_CLINIC_OR_DEPARTMENT_OTHER)
Admission: EM | Admit: 2016-11-10 | Discharge: 2016-11-10 | Disposition: A | Discharge status: Home / Self Care | Payer: Managed Care, Other (non HMO) | Attending: Emergency Medicine | Admitting: Emergency Medicine

## 2016-11-10 ENCOUNTER — Encounter (HOSPITAL_BASED_OUTPATIENT_CLINIC_OR_DEPARTMENT_OTHER): Payer: Self-pay | Admitting: Emergency Medicine

## 2016-11-10 DIAGNOSIS — R21 Rash and other nonspecific skin eruption: Secondary | ICD-10-CM | POA: Diagnosis not present

## 2016-11-10 DIAGNOSIS — J45909 Unspecified asthma, uncomplicated: Secondary | ICD-10-CM | POA: Diagnosis not present

## 2016-11-10 DIAGNOSIS — Z79899 Other long term (current) drug therapy: Secondary | ICD-10-CM | POA: Insufficient documentation

## 2016-11-10 MED ORDER — PREDNISONE 20 MG PO TABS: 40.0000 mg | ORAL_TABLET | Freq: Every day | ORAL | 0 refills | Status: AC

## 2016-11-10 MED ORDER — TRIAMCINOLONE ACETONIDE 0.1 % EX CREA: 1.0000 "application " | TOPICAL_CREAM | Freq: Two times a day (BID) | CUTANEOUS | 0 refills | Status: AC

## 2016-11-10 MED ORDER — HYDROXYZINE HCL 25 MG PO TABS: 25.0000 mg | ORAL_TABLET | Freq: Four times a day (QID) | ORAL | 0 refills | Status: AC

## 2016-11-10 MED ORDER — PREDNISONE 20 MG PO TABS
40.0000 mg | ORAL_TABLET | Freq: Once | ORAL | Status: AC
  Administered 2016-11-10: 40 mg via ORAL
  Filled 2016-11-10: qty 2

## 2016-11-10 NOTE — ED Notes (Signed)
Pt verbalizes understanding of d/c instructions and denies any further needs at this time. 

## 2016-11-10 NOTE — Discharge Instructions (Signed)
Please read and follow all provided instructions.  Your diagnoses today include:  1. Rash and nonspecific skin eruption    Tests performed today include:  Vital signs. See below for your results today.   Medications prescribed:   Prednisone - steroid medicine   It is best to take this medication in the morning to prevent sleeping problems. If you are diabetic, monitor your blood sugar closely and stop taking Prednisone if blood sugar is over 300. Take with food to prevent stomach upset.    Hydroxyzine - antihistamine  You can find this medication over-the-counter.   This medication will make you drowsy. DO NOT drive or perform any activities that require you to be awake and alert if taking this.   Triamcinolone (Kenalog) cream - topical steroid medication for skin reaction  Take any prescribed medications only as directed.  Home care instructions:  Follow any educational materials contained in this packet.  BE VERY CAREFUL not to take multiple medicines containing Tylenol (also called acetaminophen). Doing so can lead to an overdose which can damage your liver and cause liver failure and possibly death.   Follow-up instructions: Please follow-up with your primary care provider in the next 3 days for further evaluation of your symptoms if not improved.   Return instructions:   Please return to the Emergency Department if you experience worsening symptoms.   Please return if you have any other emergent concerns.  Additional Information:  Your vital signs today were: BP 120/80 (BP Location: Left Arm)    Pulse 85    Temp 98.7 F (37.1 C) (Oral)    Resp 18    Ht 5\' 8"  (1.727 m)    Wt 77.1 kg (170 lb)    LMP 11/10/2016    SpO2 100%    BMI 25.85 kg/m  If your blood pressure (BP) was elevated above 135/85 this visit, please have this repeated by your doctor within one month. --------------

## 2016-11-10 NOTE — ED Provider Notes (Signed)
[facility] Provider Note   CSN: [id number] Arrival date & time: 11/10/16  1828     History   Chief Complaint Chief Complaint  Patient presents with  . Rash    HPI Janice Harris is a 41 y.o. female.  Patient presents with complaint of rash ongoing over the past 3 days. Patient was outdoors prior to onset and noted several mosquitoes. She developed itchy papules on exposed areas of the body including her shoulders, arms, and legs. She has tried Benadryl without relief. No other family members at home have similar symptoms. She has not had any fever or viral symptoms. No recent tick bites. No new skin soaps or detergents. No suspicious foods. No tongue swelling or difficulty breathing. Patient was concerned about bedbugs. She is not staying anywhere new. Onset of symptoms acute. Course is constant.      Past Medical History:  Diagnosis Date  . Asthma     There are no active problems to display for this patient.   Past Surgical History:  Procedure Laterality Date  . CYST EXCISION  2010   on cervix    OB History    No data available       Home Medications    Prior to Admission medications   Medication Sig Start Date End Date Taking? Authorizing Provider  albuterol (PROVENTIL HFA;VENTOLIN HFA) 108 (90 BASE) MCG/ACT inhaler Inhale 2 puffs into the lungs every 6 (six) hours as needed for wheezing or shortness of breath.    [provider]  amoxicillin (AMOXIL) 500 MG capsule Take 2 capsules (1,000 mg total) by mouth 2 (two) times daily. 10/04/16   Dione BoozeGlick, David, MD  hydrOXYzine (ATARAX/VISTARIL) 25 MG tablet Take 1 tablet (25 mg total) by mouth every 6 (six) hours. 11/10/16   Renne CriglerGeiple, Shaquan Missey, PA-C  loratadine-pseudoephedrine (CLARITIN-D 12 HOUR) 5-120 MG tablet Take 1 tablet by mouth 2 (two) times daily. 07/17/16   Vanetta MuldersZackowski, Scott, MD  ondansetron (ZOFRAN ODT) 4 MG disintegrating tablet 4mg  ODT q4 hours prn nausea/vomit 11/15/14   Mesner, Barbara CowerJason, MD    predniSONE (DELTASONE) 20 MG tablet Take 2 tablets (40 mg total) by mouth daily. 11/10/16   Renne CriglerGeiple, Sekai Gitlin, PA-C  triamcinolone cream (KENALOG) 0.1 % Apply 1 application topically 2 (two) times daily. 11/10/16   Renne CriglerGeiple, Saveah Bahar, PA-C    Family History History reviewed. No pertinent family history.  Social History Social History  Substance Use Topics  . Smoking status: Never Smoker  . Smokeless tobacco: Never Used  . Alcohol use Yes     Comment: occasionally (several a month)     Allergies   Patient has no known allergies.   Review of Systems Review of Systems  Constitutional: Negative for fever.  HENT: Negative for facial swelling and trouble swallowing.   Eyes: Negative for redness.  Respiratory: Negative for shortness of breath, wheezing and stridor.   Cardiovascular: Negative for chest pain.  Gastrointestinal: Negative for nausea and vomiting.  Musculoskeletal: Negative for myalgias.  Skin: Positive for rash.  Neurological: Negative for light-headedness.  Psychiatric/Behavioral: Negative for confusion.     Physical Exam Updated Vital Signs BP 120/80 (BP Location: Left Arm)   Pulse 85   Temp 98.7 F (37.1 C) (Oral)   Resp 18   Ht 5\' 8"  (1.727 m)   Wt 77.1 kg (170 lb)   LMP 11/10/2016   SpO2 100%   BMI 25.85 kg/m   Physical Exam  Constitutional: She appears well-developed and well-nourished.  HENT:  Head:  Normocephalic and atraumatic.  No intraoral lesions.  Eyes: Conjunctivae are normal.  Neck: Normal range of motion. Neck supple.  Pulmonary/Chest: No respiratory distress.  Neurological: She is alert.  Skin: Skin is warm and dry.  Patient with several areas of scattered papules, no drainage or cellulitis. Some areas are grouped such as on the left shoulder.  Psychiatric: She has a normal mood and affect.  Nursing note and vitals reviewed.    ED Treatments / Results   Procedures Procedures (including critical care time)  Medications Ordered in  ED Medications  predniSONE (DELTASONE) tablet 40 mg (40 mg Oral Given 11/10/16 2035)     Initial Impression / Assessment and Plan / ED Course  I have reviewed the triage vital signs and the nursing notes.  Pertinent labs & imaging results that were available during my care of the patient were reviewed by me and considered in my medical decision making (see chart for details).     Patient seen and examined. Medications ordered.   Vital signs reviewed and are as follows: BP 120/80 (BP Location: Left Arm)   Pulse 85   Temp 98.7 F (37.1 C) (Oral)   Resp 18   Ht  (1.727 m)   Wt 77.1 kg (170 lb)   LMP 11/10/2016   SpO2 100%   BMI 25.85 kg/m   Will treat symptoms conservatively with antihistamine, prednisone, topical steroids.  Patient urged to return with worsening symptoms or other concerns. Patient verbalized understanding and agrees with plan.     Final Clinical Impressions(s) / ED Diagnoses   Final diagnoses:  Rash and nonspecific skin eruption   Patient with widespread areas of papules, likely insect bites. Systemic symptoms or signs of anaphylaxis. Given widespread nature, will treat systemically with oral steroids. Patient to use topical steroids on the worst areas. Will treat with hydroxyzine for itching. No new medications or signs of Stevens-Johnson.  New Prescriptions New Prescriptions   HYDROXYZINE (ATARAX/VISTARIL) 25 MG TABLET    Take 1 tablet (25 mg total) by mouth every 6 (six) hours.   PREDNISONE (DELTASONE) 20 MG TABLET    Take 2 tablets (40 mg total) by mouth daily.   TRIAMCINOLONE CREAM (KENALOG) 0.1 %    Apply 1 application topically 2 (two) times daily.     Renne Crigler, PA-C 11/10/16 2045    Nira Conn, MD 11/11/16 [phone]

## 2018-04-15 ENCOUNTER — Other Ambulatory Visit: Payer: Self-pay

## 2018-04-15 ENCOUNTER — Encounter (HOSPITAL_BASED_OUTPATIENT_CLINIC_OR_DEPARTMENT_OTHER): Payer: Self-pay | Admitting: Emergency Medicine

## 2018-04-15 ENCOUNTER — Emergency Department (HOSPITAL_BASED_OUTPATIENT_CLINIC_OR_DEPARTMENT_OTHER)
Admission: EM | Admit: 2018-04-15 | Discharge: 2018-04-16 | Disposition: A | Discharge status: Home / Self Care | Attending: Emergency Medicine | Admitting: Emergency Medicine

## 2018-04-15 DIAGNOSIS — W272XXA Contact with scissors, initial encounter: Secondary | ICD-10-CM | POA: Diagnosis not present

## 2018-04-15 DIAGNOSIS — Y929 Unspecified place or not applicable: Secondary | ICD-10-CM | POA: Insufficient documentation

## 2018-04-15 DIAGNOSIS — Y999 Unspecified external cause status: Secondary | ICD-10-CM | POA: Insufficient documentation

## 2018-04-15 DIAGNOSIS — S61211A Laceration without foreign body of left index finger without damage to nail, initial encounter: Secondary | ICD-10-CM | POA: Insufficient documentation

## 2018-04-15 DIAGNOSIS — Y9389 Activity, other specified: Secondary | ICD-10-CM | POA: Insufficient documentation

## 2018-04-15 DIAGNOSIS — J45909 Unspecified asthma, uncomplicated: Secondary | ICD-10-CM | POA: Diagnosis not present

## 2018-04-15 DIAGNOSIS — Z79899 Other long term (current) drug therapy: Secondary | ICD-10-CM | POA: Insufficient documentation

## 2018-04-15 NOTE — ED Triage Notes (Signed)
Pt states she cut her left index finger with scissors

## 2018-04-16 MED ORDER — IBUPROFEN 400 MG PO TABS
600.0000 mg | ORAL_TABLET | Freq: Once | ORAL | Status: AC
  Administered 2018-04-16: 600 mg via ORAL
  Filled 2018-04-16: qty 1

## 2018-04-16 NOTE — Discharge Instructions (Signed)

## 2018-04-16 NOTE — ED Provider Notes (Signed)
[facility] EMERGENCY DEPARTMENT Provider Note   CSN: [id number] Arrival date & time: 04/15/18  2216     History   Chief Complaint Chief Complaint  Patient presents with  . Laceration    HPI Janice Harris is a 43 y.o. female who presents today for evaluation of a left index finger laceration.  She reports that 2 hours prior to arrival she was cutting open a package when she sliced the distal tip of her left index finger with scissors.  She was able to control the bleeding with pressure.  She reports her tetanus is up-to-date, last shot in the past year.    HPI  Past Medical History:  Diagnosis Date  . Asthma     There are no active problems to display for this patient.   Past Surgical History:  Procedure Laterality Date  . CYST EXCISION  2010   on cervix     OB History   No obstetric history on file.      Home Medications    Prior to Admission medications   Medication Sig Start Date End Date Taking? Authorizing Provider  albuterol (PROVENTIL HFA;VENTOLIN HFA) 108 (90 BASE) MCG/ACT inhaler Inhale 2 puffs into the lungs every 6 (six) hours as needed for wheezing or shortness of breath.    [provider]  amoxicillin (AMOXIL) 500 MG capsule Take 2 capsules (1,000 mg total) by mouth 2 (two) times daily. 10/04/16   Dione BoozeGlick, David, MD  hydrOXYzine (ATARAX/VISTARIL) 25 MG tablet Take 1 tablet (25 mg total) by mouth every 6 (six) hours. 11/10/16   Renne CriglerGeiple, Joshua, PA-C  loratadine-pseudoephedrine (CLARITIN-D 12 HOUR) 5-120 MG tablet Take 1 tablet by mouth 2 (two) times daily. 07/17/16   Vanetta MuldersZackowski, Scott, MD  ondansetron (ZOFRAN ODT) 4 MG disintegrating tablet 4mg  ODT q4 hours prn nausea/vomit 11/15/14   Mesner, Barbara CowerJason, MD  predniSONE (DELTASONE) 20 MG tablet Take 2 tablets (40 mg total) by mouth daily. 11/10/16   Renne CriglerGeiple, Joshua, PA-C  triamcinolone cream (KENALOG) 0.1 % Apply 1 application topically 2 (two) times daily. 11/10/16   Renne CriglerGeiple, Joshua, PA-C    Family  History Family History  Problem Relation Age of Onset  . Cancer Other   . Hypertension Other   . Diabetes Other     Social History Social History   Tobacco Use  . Smoking status: Never Smoker  . Smokeless tobacco: Never Used  Substance Use Topics  . Alcohol use: Yes    Comment: occasionally (several a month)  . Drug use: No     Allergies   Patient has no known allergies.   Review of Systems Review of Systems  Constitutional: Negative for chills and fever.  All other systems reviewed and are negative.    Physical Exam Updated Vital Signs BP 118/81 (BP Location: Right Arm)   Pulse 75   Temp 98.1 F (36.7 C) (Oral)   Resp 16   Ht 5\' 7"  (1.702 m)   Wt 78.9 kg   LMP 04/03/2018 (Exact Date)   SpO2 100%   BMI 27.25 kg/m   Physical Exam Vitals signs and nursing note reviewed.  Constitutional:      General: She is not in acute distress. HENT:     Head: Normocephalic and atraumatic.  Musculoskeletal:     Comments: Full active range of motion of left index finger.  Skin:    General: Skin is warm and dry.     Comments: 1.5 cm laceration to the distal tip of the  left index finger.  Laceration does not involve nail or bed.  Extends very slightly through the dermis.  No foreign bodies visualized.  Bleeding is controlled.    Neurological:     General: No focal deficit present.     Mental Status: She is alert.     Comments: Sensation intact to left index finger.  Psychiatric:        Mood and Affect: Mood normal.        Behavior: Behavior normal.      ED Treatments / Results  Labs (all labs ordered are listed, but only abnormal results are displayed) Labs Reviewed - No data to display  EKG None  Radiology No results found.  Procedures .Marland KitchenLaceration Repair Date/Time: 04/16/2018 12:15 AM Performed by: Cristina Gong, PA-C Authorized by: Cristina Gong, PA-C   Consent:    Consent obtained:  Verbal   Consent given by:  Patient   Risks  discussed:  Infection, need for additional repair, poor cosmetic result, pain, retained foreign body, tendon damage, vascular damage, poor wound healing and nerve damage   Alternatives discussed:  No treatment and referral (Alternative wound closures) Anesthesia (see MAR for exact dosages):    Anesthesia method: Patient allowed to place finger, in sterile gauze/glove, into ice water. Laceration details:    Location:  Finger   Finger location:  L index finger   Length (cm):  1.5 Repair type:    Repair type:  Simple Pre-procedure details:    Preparation:  Patient was prepped and draped in usual sterile fashion (Patient declined x-rays) Exploration:    Hemostasis achieved with:  Direct pressure   Wound exploration: wound explored through full range of motion and entire depth of wound probed and visualized     Wound extent: areolar tissue violated     Wound extent: no foreign bodies/material noted     Contaminated: no   Treatment:    Area cleansed with:  Saline and Shur-Clens   Amount of cleaning:  Standard Skin repair:    Repair method:  Sutures   Suture size:  5-0   Suture material:  Plain gut   Suture technique:  Simple interrupted   Number of sutures:  2 Approximation:    Approximation:  Close Post-procedure details:    Dressing:  Antibiotic ointment and non-adherent dressing   Patient tolerance of procedure:  Tolerated well, no immediate complications   (including critical care time)  Medications Ordered in ED Medications  ibuprofen (ADVIL,MOTRIN) tablet 600 mg (has no administration in time range)     Initial Impression / Assessment and Plan / ED Course  I have reviewed the triage vital signs and the nursing notes.  Pertinent labs & imaging results that were available during my care of the patient were reviewed by me and considered in my medical decision making (see chart for details).    Patient presents today for evaluation after a finger laceration with scissors  that occurred shortly prior to arrival.  Tetanus is up-to-date.  Wound was irrigated.  Discussed closure options including no treatment, patient elected for sutures.  2 simple interrupted sutures were placed.  Discussed sutured wound care.  She is to get sutures removed in 7 to 10 days.  Discussed prophylactic antibiotic use.  Wound is very superficial, patient is young and otherwise healthy.  I offered her antibiotics however she elected for topical antibiotic ointment and observation.  Ibuprofen and Tylenol as needed for pain.  I did offer her short course of  Vicodin however she declined this.  Given mechanism not suspicious for underlying fracture.  Return precautions were discussed with patient who states their understanding.  At the time of discharge patient denied any unaddressed complaints or concerns.  Patient is agreeable for discharge home.   Final Clinical Impressions(s) / ED Diagnoses   Final diagnoses:  Laceration of left index finger without foreign body without damage to nail, initial encounter    ED Discharge Orders    None       Cristina Gong, PA-C 04/16/18 0021    Melene Plan, DO 04/18/18 [phone]

## 2018-08-08 IMAGING — DX DG CHEST 2V
2 series · 2 of 2 positions shown · non-contrast
Comparison: PA and lateral chest x-ray February 24, 2014

CLINICAL DATA: Several days of shortness of breath associated with
chest tightness. Nonsmoker.

EXAM:
CHEST  2 VIEW

[chest pa]
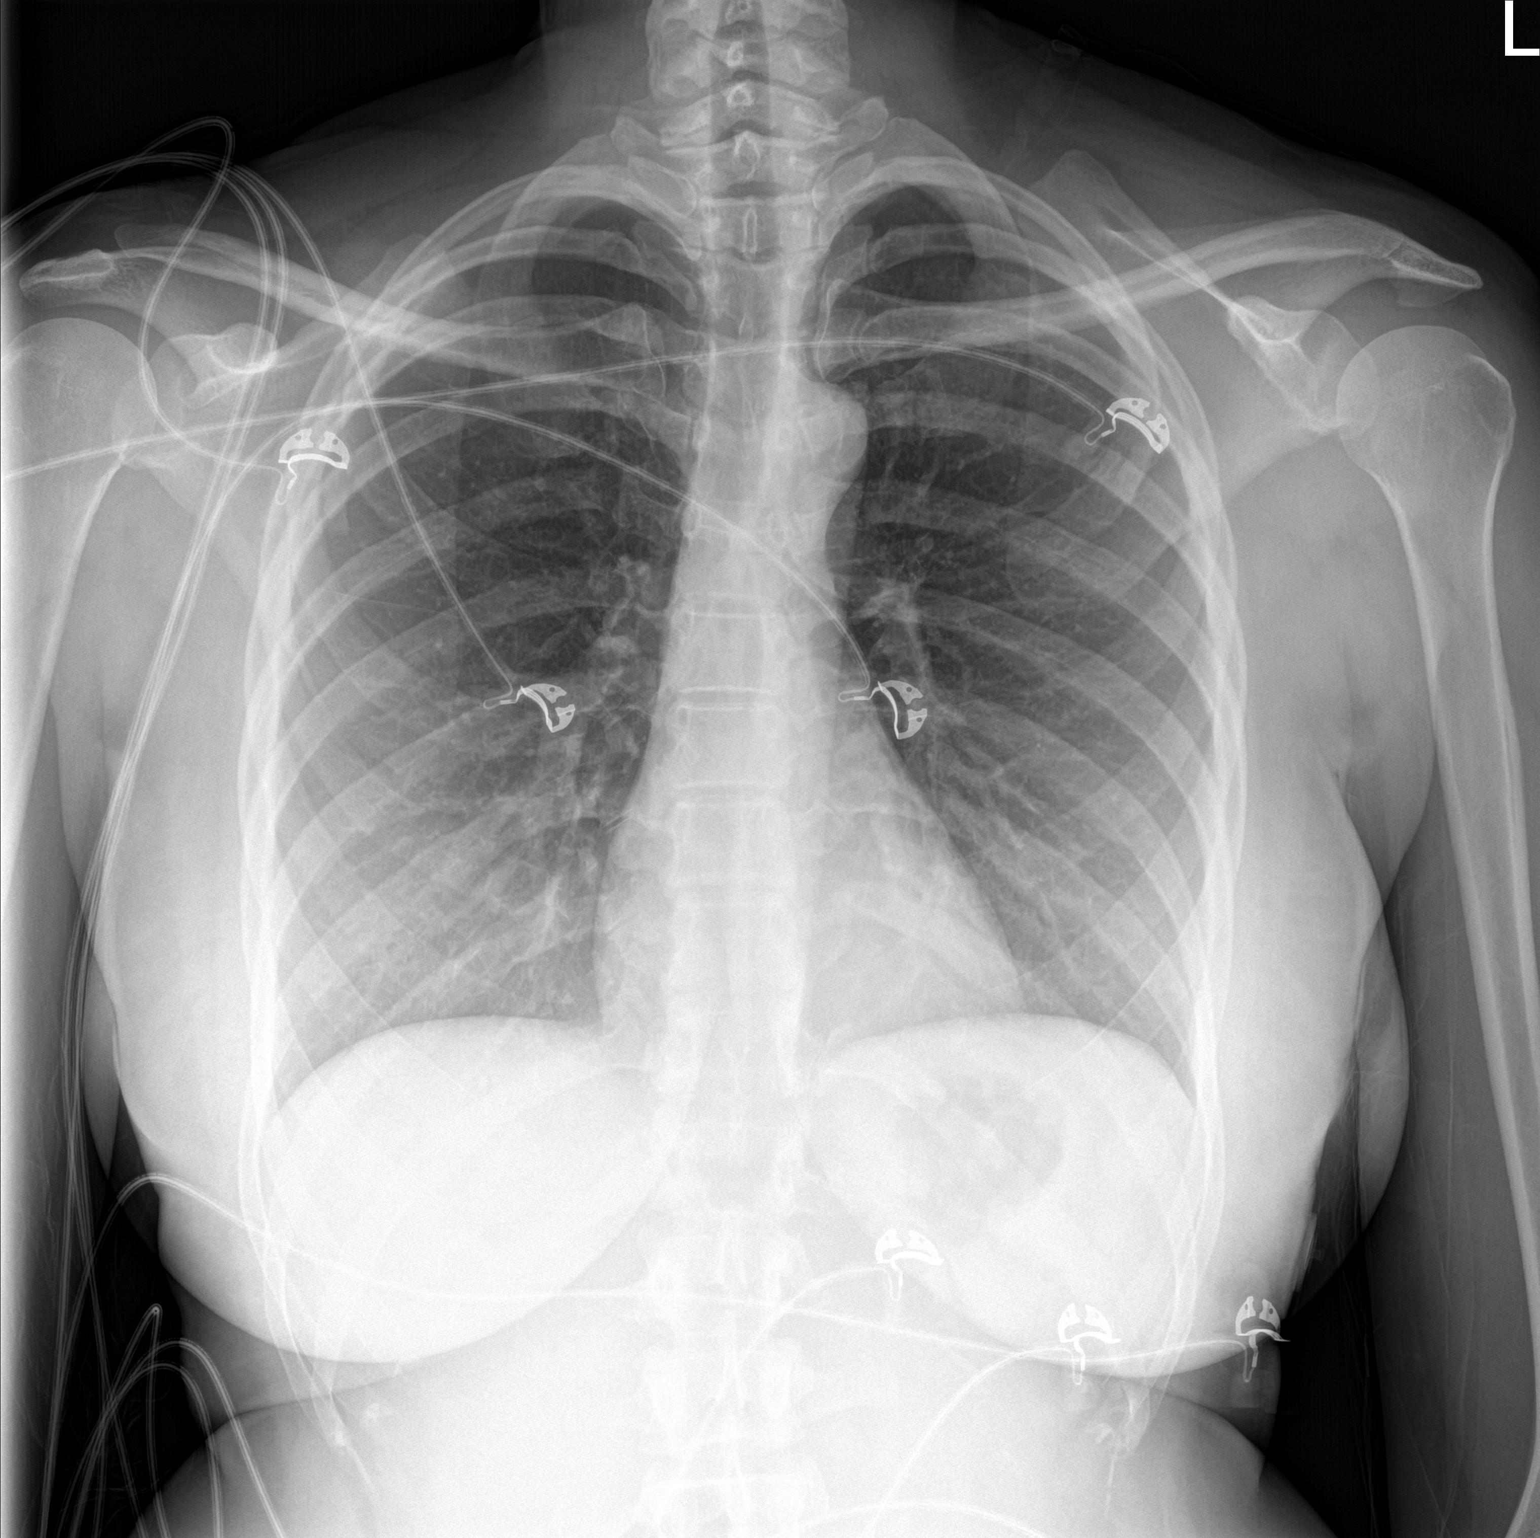

[chest lat]
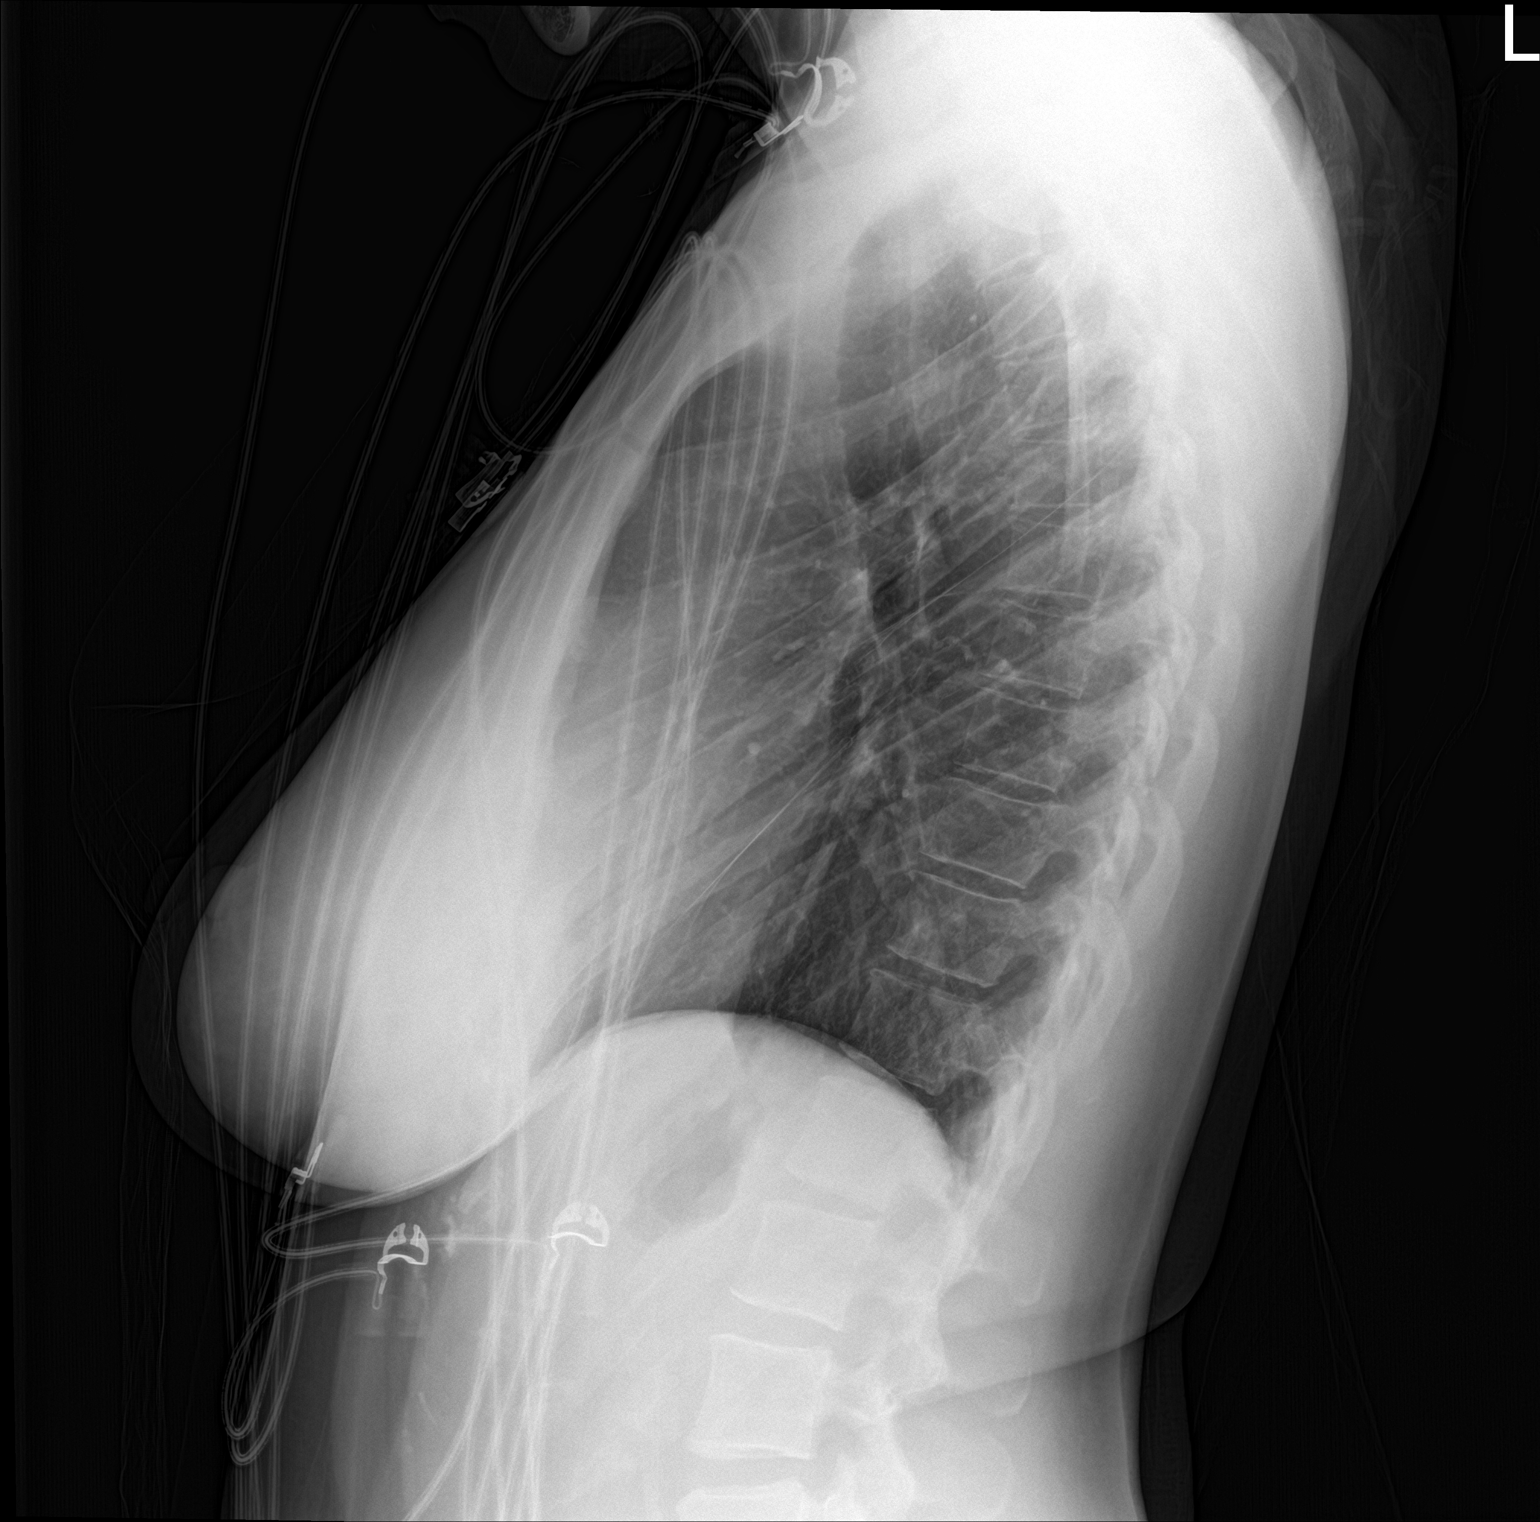

[2 of 2 positions shown; findings below may reference images not displayed]

FINDINGS: The lungs are well-expanded. There is no focal infiltrate. There is
no pleural effusion. The heart and pulmonary vascularity are normal.
The mediastinum is normal in width. The bony thorax is unremarkable.
IMPRESSION: There is no active cardiopulmonary disease.

## 2019-07-06 ENCOUNTER — Encounter (HOSPITAL_BASED_OUTPATIENT_CLINIC_OR_DEPARTMENT_OTHER): Payer: Self-pay | Admitting: Emergency Medicine

## 2019-07-06 ENCOUNTER — Emergency Department (HOSPITAL_BASED_OUTPATIENT_CLINIC_OR_DEPARTMENT_OTHER)
Admission: EM | Admit: 2019-07-06 | Discharge: 2019-07-06 | Disposition: A | Discharge status: Home / Self Care | Attending: Emergency Medicine | Admitting: Emergency Medicine

## 2019-07-06 ENCOUNTER — Other Ambulatory Visit: Payer: Self-pay

## 2019-07-06 ENCOUNTER — Emergency Department (HOSPITAL_BASED_OUTPATIENT_CLINIC_OR_DEPARTMENT_OTHER)

## 2019-07-06 DIAGNOSIS — Y999 Unspecified external cause status: Secondary | ICD-10-CM | POA: Diagnosis not present

## 2019-07-06 DIAGNOSIS — S161XXA Strain of muscle, fascia and tendon at neck level, initial encounter: Secondary | ICD-10-CM | POA: Insufficient documentation

## 2019-07-06 DIAGNOSIS — Y9389 Activity, other specified: Secondary | ICD-10-CM | POA: Diagnosis not present

## 2019-07-06 DIAGNOSIS — Y92410 Unspecified street and highway as the place of occurrence of the external cause: Secondary | ICD-10-CM | POA: Diagnosis not present

## 2019-07-06 DIAGNOSIS — J45909 Unspecified asthma, uncomplicated: Secondary | ICD-10-CM | POA: Diagnosis not present

## 2019-07-06 DIAGNOSIS — Z79899 Other long term (current) drug therapy: Secondary | ICD-10-CM | POA: Diagnosis not present

## 2019-07-06 DIAGNOSIS — S46911A Strain of unspecified muscle, fascia and tendon at shoulder and upper arm level, right arm, initial encounter: Secondary | ICD-10-CM | POA: Diagnosis not present

## 2019-07-06 DIAGNOSIS — S199XXA Unspecified injury of neck, initial encounter: Secondary | ICD-10-CM | POA: Diagnosis present

## 2019-07-06 MED ORDER — NAPROXEN 500 MG PO TABS: 500.0000 mg | ORAL_TABLET | Freq: Two times a day (BID) | ORAL | 0 refills | Status: AC | PRN

## 2019-07-06 MED ORDER — KETOROLAC TROMETHAMINE 60 MG/2ML IM SOLN
30.0000 mg | Freq: Once | INTRAMUSCULAR | Status: AC
  Administered 2019-07-06: 30 mg via INTRAMUSCULAR
  Filled 2019-07-06: qty 2

## 2019-07-06 MED ORDER — CYCLOBENZAPRINE HCL 10 MG PO TABS: 10.0000 mg | ORAL_TABLET | Freq: Two times a day (BID) | ORAL | 0 refills | Status: AC | PRN

## 2019-07-06 MED FILL — NAPROXEN 500 MG TABS: 500 | 15 days supply | Qty: 30 | Fill #0

## 2019-07-06 MED FILL — CYCLOBENZAPRINE HCL 10 MG T: 10 | 10 days supply | Qty: 20 | Fill #0

## 2019-07-06 NOTE — Discharge Instructions (Signed)
Recommend taking Tylenol, anti-inflammatory such as naproxen or Motrin and muscle relaxer as needed for pain control.  Note the muscle relaxer can make you drowsy should not be taken while driving or operating heavy machinery.  If you develop worsening chest pain, abdominal pain or other new concerning symptom recommend return to ER for reassessment.

## 2019-07-06 NOTE — ED Notes (Signed)
Pt verbalized understanding of dc instructions.
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
